# Patient Record
Sex: Male | Born: 1970 | Race: White | Hispanic: No | Smoking: Current every day smoker
Health system: Southern US, Community
[De-identification: ages and names within clinical notes are randomized; demographics above are authoritative.]

## PROBLEM LIST (undated history)

## (undated) DIAGNOSIS — M5030 Other cervical disc degeneration, unspecified cervical region: Secondary | ICD-10-CM

## (undated) DIAGNOSIS — F4310 Post-traumatic stress disorder, unspecified: Secondary | ICD-10-CM

## (undated) HISTORY — PX: HERNIA REPAIR: SHX51

---

## 2005-04-27 ENCOUNTER — Emergency Department: Payer: Self-pay | Admitting: Unknown Physician Specialty

## 2005-04-27 ENCOUNTER — Other Ambulatory Visit: Payer: Self-pay

## 2006-06-09 ENCOUNTER — Emergency Department: Payer: Self-pay | Admitting: Emergency Medicine

## 2010-08-29 ENCOUNTER — Emergency Department (HOSPITAL_COMMUNITY): Admission: EM | Admit: 2010-08-29 | Discharge: 2010-08-29 | Payer: Self-pay | Admitting: Emergency Medicine

## 2017-03-01 ENCOUNTER — Emergency Department: Payer: Self-pay

## 2017-03-01 ENCOUNTER — Encounter: Payer: Self-pay | Admitting: Emergency Medicine

## 2017-03-01 ENCOUNTER — Emergency Department
Admission: EM | Admit: 2017-03-01 | Discharge: 2017-03-01 | Disposition: A | Discharge status: Home / Self Care | Payer: Self-pay | Attending: Emergency Medicine | Admitting: Emergency Medicine

## 2017-03-01 DIAGNOSIS — D168 Benign neoplasm of pelvic bones, sacrum and coccyx: Secondary | ICD-10-CM | POA: Insufficient documentation

## 2017-03-01 DIAGNOSIS — M545 Low back pain, unspecified: Secondary | ICD-10-CM

## 2017-03-01 DIAGNOSIS — R52 Pain, unspecified: Secondary | ICD-10-CM

## 2017-03-01 DIAGNOSIS — F17210 Nicotine dependence, cigarettes, uncomplicated: Secondary | ICD-10-CM | POA: Insufficient documentation

## 2017-03-01 DIAGNOSIS — D492 Neoplasm of unspecified behavior of bone, soft tissue, and skin: Secondary | ICD-10-CM

## 2017-03-01 HISTORY — DX: Post-traumatic stress disorder, unspecified: F43.10

## 2017-03-01 MED ORDER — OXYCODONE-ACETAMINOPHEN 5-325 MG PO TABS: 1.0000 | ORAL_TABLET | Freq: Four times a day (QID) | ORAL | 0 refills | Status: DC | PRN

## 2017-03-01 MED ORDER — OXYCODONE-ACETAMINOPHEN 5-325 MG PO TABS
1.0000 | ORAL_TABLET | Freq: Once | ORAL | Status: AC
  Administered 2017-03-01: 1 via ORAL
  Filled 2017-03-01: qty 1

## 2017-03-01 MED ORDER — IBUPROFEN 600 MG PO TABS: 600.0000 mg | ORAL_TABLET | Freq: Three times a day (TID) | ORAL | 0 refills | Status: DC | PRN

## 2017-03-01 MED ORDER — IBUPROFEN 600 MG PO TABS
600.0000 mg | ORAL_TABLET | Freq: Once | ORAL | Status: AC
  Administered 2017-03-01: 600 mg via ORAL
  Filled 2017-03-01: qty 1

## 2017-03-01 NOTE — ED Triage Notes (Signed)
Pt ambulatory to triage with no difficulty. Pt reports he is having lower back pain. Pt reports hx same. Pt reports pain wore over the last few days after doing some heavy lifting.

## 2017-03-01 NOTE — Discharge Instructions (Signed)
Please take your Percocet as needed for pain in the next day or 2. Return to the emergency department for any concerns. Please make an appointment to follow-up with an orthopedic surgeon regarding your chronic mass in your pelvis. This needs to be further evaluated and treated.  It was a pleasure to take care of you today, and thank you for coming to our emergency department.  If you have any questions or concerns before leaving please ask the nurse to grab me and I'm more than happy to go through your aftercare instructions again.  If you were prescribed any opioid pain medication today such as Norco, Vicodin, Percocet, morphine, hydrocodone, or oxycodone please make sure you do not drive when you are taking this medication as it can alter your ability to drive safely.  If you have any concerns once you are home that you are not improving or are in fact getting worse before you can make it to your follow-up appointment, please do not hesitate to call 911 and come back for further evaluation.  Darel Hong MD  No results found for this or any previous visit. Dg Lumbar Spine Complete  Result Date: 03/01/2017 CLINICAL DATA:  46 y/o M; lower back pain worse over the last few days after heavy lifting. EXAM: LUMBAR SPINE - COMPLETE 4+ VIEW COMPARISON:  08/29/2010 lumbar radiographs FINDINGS: Transitional L5 vertebral body with partial sacralization with. Normal lumbar lordosis without listhesis. Lumbar spondylosis with moderate L4-5 disc space narrowing. Lower lumbar facet arthrosis. No loss of vertebral body height. Lumbar changes appear stable from prior lumbar radiographs. IMPRESSION: 1. No acute osseous abnormality identified. 2. Transitional lumbosacral anatomy. Stable lumbar degenerative changes. 3. Please refer to concurrent pelvic radiographs for evaluation of bony pelvis. Electronically Signed   By: Kristine Garbe M.D.   On: 03/01/2017 04:55   Dg Pelvis 1-2 Views  Result Date:  03/01/2017 CLINICAL DATA:  46 y/o M; lower back pain worse over the last few days after heavy lifting. EXAM: PELVIS - 1-2 VIEW COMPARISON:  08/29/2010 pelvis radiographs. FINDINGS: Mixed lytic and sclerotic lesion within the left ilium is grossly stable from prior pelvic radiographs. Bilateral hip joints, sacroiliac joints, and symphysis pubis are well maintained. No acute fracture identified. IMPRESSION: No acute fracture or dislocation. 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 This can be further assessed with pelvic MRI with and without contrast. Electronically Signed   By: Kristine Garbe M.D.   On: 03/01/2017 04:52

## 2017-03-01 NOTE — ED Provider Notes (Signed)
[facility] Emergency Department Provider Note  ____________________________________________   First MD Initiated Contact with Patient 03/01/17 0411     (approximate)  I have reviewed the triage vital signs and the nursing notes.   HISTORY  Chief Complaint Back Pain    HPI Patrick Hammond. is a 46 y.o. male who comes to the emergency department with an acute exacerbation of low back pain. He has a long-standing history of low back pain but is not had an episode in over a year. He has recently been working fixing rooms in the heat and yesterday felt a tweak in his back. He has moderate to severe aching left low back pain worse with movement and improved with rest. It does not radiate down his legs. He denies particular trauma. He denies fevers or chills. He denies numbness or weakness. He denies urinary or fecal incontinence or hesitance. He took a Ambulance person from a friend which helped his pain.   Past Medical History:  Diagnosis Date  . PTSD (post-traumatic stress disorder)     There are no active problems to display for this patient.   Past Surgical History:  Procedure Laterality Date  . HERNIA REPAIR      Prior to Admission medications   Medication Sig Start Date End Date Taking? Authorizing Provider  ibuprofen (ADVIL,MOTRIN) 600 MG tablet Take 1 tablet (600 mg total) by mouth every 8 (eight) hours as needed. 03/01/17   Darel Hong, MD  oxyCODONE-acetaminophen (ROXICET) 5-325 MG tablet Take 1 tablet by mouth every 6 (six) hours as needed. 03/01/17   Darel Hong, MD    Allergies Shellfish allergy  History reviewed. No pertinent family history.  Social History Social History  Substance Use Topics  . Smoking status: Current Every Day Smoker    Packs/day: 1.50    Years: 25.00  . Smokeless tobacco: Never Used  . Alcohol use No    Review of Systems Constitutional: No fever/chills Eyes: No visual changes. ENT: No sore  throat. Cardiovascular: Denies chest pain. Respiratory: Denies shortness of breath. Gastrointestinal: No abdominal pain.  No nausea, no vomiting.  No diarrhea.  No constipation. Genitourinary: Negative for dysuria. Musculoskeletal: Positive for back pain. Skin: Negative for rash. Neurological: Negative for headaches, focal weakness or numbness.  10-point ROS otherwise negative.  ____________________________________________   PHYSICAL EXAM:  VITAL SIGNS: ED Triage Vitals  Enc Vitals Group     BP 03/01/17 0018 132/77     Pulse Rate 03/01/17 0018 78     Resp 03/01/17 0018 18     Temp 03/01/17 0018 98.1 F (36.7 C)     Temp Source 03/01/17 0018 Oral     SpO2 03/01/17 0018 100 %     Weight 03/01/17 0018 190 lb (86.2 kg)     Height 03/01/17 0018 4\' 10"  (1.473 m)     Head Circumference --      Peak Flow --      Pain Score 03/01/17 0017 10     Pain Loc --      Pain Edu? --      Excl. in [place]? --     Constitutional: Alert and oriented x 4 Appears uncomfortable wincing in pain Eyes: PERRL EOMI. Head: Atraumatic. Nose: No congestion/rhinnorhea. Mouth/Throat: No trismus Neck: No stridor.   Cardiovascular: Normal rate, regular rhythm. Grossly normal heart sounds.  Good peripheral circulation. Respiratory: Normal respiratory effort.  No retractions. Lungs CTAB and moving good air Gastrointestinal: Soft nontender Musculoskeletal: No midline  back tenderness he is tender left low back paraspinal muscle spasm 5 out of 5 hip flexion and hip extension plantar flexion dorsiflexion 2+ DTRs no ankle clonus ambulates with strong steady but antalgic gait Neurologic:  Normal speech and language. No gross focal neurologic deficits are appreciated. Skin:  Skin is warm, dry and intact. No rash noted. Psychiatric: Mood and affect are normal. Speech and behavior are normal.     ____________________________________________   LABS (all labs ordered are listed, but only abnormal results are  displayed)  Labs Reviewed - No data to display   __________________________________________  EKG   ____________________________________________  RADIOLOGY  Lumbar spine x-ray with no acute disease pelvic x-ray with stable tumor ____________________________________________   PROCEDURES  Procedure(s) performed: no  Procedures  Critical Care performed: no  Observation: no ____________________________________________   INITIAL IMPRESSION / ASSESSMENT AND PLAN / ED COURSE  Pertinent labs & imaging results that were available during my care of the patient were reviewed by me and considered in my medical decision making (see chart for details).  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 The patient says he has had an MRI in the past but does not know the results and was told that he was supposed to have surgery on his back but he declined it at that time. Given this unclear history I'll obtain x-rays today.  Fortunately the patient's x-ray shows that the tumor is in stable condition. He has no red flags for cauda equina or other serious low back issues. His pain is improved with Percocet. I will give him a short course for his acute exacerbation and refer him to orthopedic surgery outpatient. He is discharged home in improved and good condition.      ____________________________________________   FINAL CLINICAL IMPRESSION(S) / ED DIAGNOSES  Final diagnoses:  Pain  Acute left-sided low back pain without sciatica  Bone tumor      NEW MEDICATIONS STARTED DURING THIS VISIT:  Discharge Medication List as of 03/01/2017  5:39 AM    START taking these medications   Details  ibuprofen (ADVIL,MOTRIN) 600 MG tablet Take 1 tablet (600 mg total) by mouth every 8 (eight) hours as needed., Starting Mon 03/01/2017, Print    oxyCODONE-acetaminophen (ROXICET) 5-325 MG tablet Take 1  tablet by mouth every 6 (six) hours as needed., Starting Mon 03/01/2017, Print         Note:  This document was prepared using Dragon voice recognition software and may include unintentional dictation errors.     Darel Hong, MD 03/01/17 [id number]

## 2017-05-30 ENCOUNTER — Emergency Department: Payer: Self-pay

## 2017-05-30 ENCOUNTER — Encounter: Payer: Self-pay | Admitting: Emergency Medicine

## 2017-05-30 ENCOUNTER — Other Ambulatory Visit: Payer: Self-pay

## 2017-05-30 ENCOUNTER — Emergency Department
Admission: EM | Admit: 2017-05-30 | Discharge: 2017-05-30 | Discharge status: Against Medical Advice | Payer: Self-pay | Attending: Emergency Medicine | Admitting: Emergency Medicine

## 2017-05-30 DIAGNOSIS — Z5320 Procedure and treatment not carried out because of patient's decision for unspecified reasons: Secondary | ICD-10-CM | POA: Insufficient documentation

## 2017-05-30 DIAGNOSIS — F17210 Nicotine dependence, cigarettes, uncomplicated: Secondary | ICD-10-CM | POA: Insufficient documentation

## 2017-05-30 DIAGNOSIS — R202 Paresthesia of skin: Secondary | ICD-10-CM | POA: Insufficient documentation

## 2017-05-30 HISTORY — DX: Other cervical disc degeneration, unspecified cervical region: M50.30

## 2017-05-30 MED ORDER — KETOROLAC TROMETHAMINE 30 MG/ML IJ SOLN: 30.0000 mg | Freq: Once | INTRAMUSCULAR | Status: DC

## 2017-05-30 NOTE — ED Triage Notes (Signed)
Pt reports 3 weeks of bilateral hand numbness, worse in the left; numbness radiates up the back of both arms; pt says at nights his hands will "draw up" and wake him from sleep; pt with history of cervical disc problems;

## 2017-05-30 NOTE — ED Notes (Signed)
Patient is c/o bilateral numb hand pain that includes his whole thumb, first digit, and half of his remaining digits.  Patient states he works a lot with his hands and has a lot of repetitive motion.  He states this has been a problem for three weeks.

## 2017-05-30 NOTE — ED Provider Notes (Signed)
[facility] Emergency Department Provider Note   ____________________________________________   First MD Initiated Contact with Patient 05/30/17 2308     (approximate)  I have reviewed the triage vital signs and the nursing notes.   HISTORY  Chief Complaint Numbness    HPI Patrick Hammond. is a 46 y.o. male who comes into the hospital today with some bilateral hand numbness.the patient states that the symptoms started about 3 weeks to a month ago. He states that it has been bothering him which is why he came into the hospital this evening. He states that his fingers are cold and he can't feel them. He works 7 days a week so he is unable to just stop and come by to be evaluated. The patient states that the numbness is worse left greater than right. He reports the numbness is from his hand all the way to the tips of his fingers. The patient also has some numbness going underneath his arms bilaterally. He states he feels weak in his hands as well. The patient denies any speech or gait difficulties. He reports that it does hurt and it wakes him up 4 to 5 times per night. The patient states that his fingers lock up and he cant move. The patient rates his pain and 8 out of 10 in intensity.He's had some occasional numbness in his feet as well. The patient has a of back problems but denies any neck problems. The patient has been taking percocet which he was given by some friends and family. He is here today for evaluation   Past Medical History:  Diagnosis Date  . Degenerative disc disease, cervical   . PTSD (post-traumatic stress disorder)     There are no active problems to display for this patient.   Past Surgical History:  Procedure Laterality Date  . HERNIA REPAIR      Prior to Admission medications   Medication Sig Start Date End Date Taking? Authorizing Provider  ibuprofen (ADVIL,MOTRIN) 600 MG tablet Take 1 tablet (600 mg total) by mouth every 8 (eight)  hours as needed. 03/01/17  Yes Darel Hong, MD    Allergies Shellfish allergy  History reviewed. No pertinent family history.  Social History Social History  Substance Use Topics  . Smoking status: Current Every Day Smoker    Packs/day: 2.00    Years: 25.00    Types: Cigarettes  . Smokeless tobacco: Never Used  . Alcohol use No    Review of Systems  Constitutional: No fever/chills Eyes: No visual changes. ENT: No sore throat. Cardiovascular: Denies chest pain. Respiratory: Denies shortness of breath. Gastrointestinal: No abdominal pain.  No nausea, no vomiting.  No diarrhea.  No constipation. Genitourinary: Negative for dysuria. Musculoskeletal: Negative for back pain. Skin: Negative for rash. Neurological: numbness and weakness to bilateral heands   ____________________________________________   PHYSICAL EXAM:  VITAL SIGNS: ED Triage Vitals  Enc Vitals Group     BP 05/30/17 2105 (!) 161/84     Pulse Rate 05/30/17 2105 84     Resp 05/30/17 2105 18     Temp 05/30/17 2105 98.3 F (36.8 C)     Temp Source 05/30/17 2105 Oral     SpO2 05/30/17 2105 97 %     Weight 05/30/17 2106 190 lb (86.2 kg)     Height 05/30/17 2106 5\' 10"  (1.778 m)     Head Circumference --      Peak Flow --  Pain Score 05/30/17 2105 6     Pain Loc --      Pain Edu? --      Excl. in [place]? --     Constitutional: Alert and oriented. Well appearing and in mild distress. Eyes: Conjunctivae are normal. PERRL. EOMI. Head: Atraumatic. Nose: No congestion/rhinnorhea. Mouth/Throat: Mucous membranes are moist.  Oropharynx non-erythematous. Neck: No cervical spine tenderness to palpation. Cardiovascular: Normal rate, regular rhythm. Grossly normal heart sounds.  Good peripheral circulation. Respiratory: Normal respiratory effort.  No retractions. Lungs CTAB. Gastrointestinal: Soft and nontender. No distention. Positive bowel sounds Musculoskeletal: No lower extremity tenderness nor edema.     Neurologic:  Normal speech and language. Cranial nerves II - XII are grossly intact, strength 5/5 in upper and lower extremities, patient endorses some numbness to his fingers bilaterally Skin:  Skin is warm, dry and intact.  Psychiatric: Mood and affect are normal.  ____________________________________________   LABS (all labs ordered are listed, but only abnormal results are displayed)  Labs Reviewed - No data to display ____________________________________________  EKG  none ____________________________________________  RADIOLOGY  No results found.  ____________________________________________   PROCEDURES  Procedure(s) performed: None  Procedures  Critical Care performed: No  ____________________________________________   INITIAL IMPRESSION / ASSESSMENT AND PLAN / ED COURSE  Pertinent labs & imaging results that were available during my care of the patient were reviewed by me and considered in my medical decision making (see chart for details).  This is a 46 year old male who comes into the hospital today with some numbness to his bilateral fingers. I did evaluate the patient and then ordered a CT scan of his head and cervical spine. I also ordered a CBC and BMP to evaluate for possible neuropathy causing the symptoms. I also ordered some Toradol to treat the patient's discomfort. After I left the room the patient decided he did not want to stay for his evaluation. The patient stated that he has to work tomorrow and he does not want to be here half the night. I informed the patient that I am unable to help determine what may be the cause of his symptoms and he reports that he is okay with that. He will sign out [place]. The patient should follow-up with his primary care physician or with the walk in clinic.      ____________________________________________   FINAL CLINICAL IMPRESSION(S) / ED DIAGNOSES  Final diagnoses:  Paresthesia      NEW  MEDICATIONS STARTED DURING THIS VISIT:  Discharge Medication List as of 05/30/2017 11:37 PM       Note:  This document was prepared using Dragon voice recognition software and may include unintentional dictation errors.    Loney Hering, MD 05/30/17 [phone]

## 2017-05-30 NOTE — ED Notes (Signed)
Patient stating he can't stay any longer and wishes to leave.  MD notified, AMA form signed.

## 2017-06-28 ENCOUNTER — Encounter: Payer: Self-pay | Admitting: Emergency Medicine

## 2017-06-28 ENCOUNTER — Emergency Department
Admission: EM | Admit: 2017-06-28 | Discharge: 2017-06-28 | Disposition: A | Discharge status: Home / Self Care | Payer: Self-pay | Attending: Emergency Medicine | Admitting: Emergency Medicine

## 2017-06-28 DIAGNOSIS — Z79899 Other long term (current) drug therapy: Secondary | ICD-10-CM | POA: Insufficient documentation

## 2017-06-28 DIAGNOSIS — F1110 Opioid abuse, uncomplicated: Secondary | ICD-10-CM | POA: Insufficient documentation

## 2017-06-28 DIAGNOSIS — F17210 Nicotine dependence, cigarettes, uncomplicated: Secondary | ICD-10-CM | POA: Insufficient documentation

## 2017-06-28 LAB — ACETAMINOPHEN LEVEL: Acetaminophen (Tylenol), Serum: <10 ug/mL — ABNORMAL LOW (ref 10–30)

## 2017-06-28 LAB — COMPREHENSIVE METABOLIC PANEL
ALT: 19 U/L (ref 17–63)
AST: 28 U/L (ref 15–41)
Albumin: 3.9 g/dL (ref 3.5–5.0)
Alkaline Phosphatase: 90 U/L (ref 38–126)
Anion gap: 6 (ref 5–15)
BILIRUBIN TOTAL: 0.4 mg/dL (ref 0.3–1.2)
BUN: 13 mg/dL (ref 6–20)
CO2: 25 mmol/L (ref 22–32)
Calcium: 9 mg/dL (ref 8.9–10.3)
Chloride: 105 mmol/L (ref 101–111)
Creatinine, Ser: 0.9 mg/dL (ref 0.61–1.24)
GFR calc Af Amer: >60 mL/min (ref >60)
Glucose, Bld: 121 mg/dL — ABNORMAL HIGH (ref 65–99)
POTASSIUM: 4 mmol/L (ref 3.5–5.1)
Sodium: 136 mmol/L (ref 135–145)
TOTAL PROTEIN: 8 g/dL (ref 6.5–8.1)

## 2017-06-28 LAB — URINE DRUG SCREEN, QUALITATIVE (ARMC ONLY)
AMPHETAMINES, UR SCREEN: POSITIVE — AB
BARBITURATES, UR SCREEN: NOT DETECTED
BENZODIAZEPINE, UR SCRN: NOT DETECTED
CANNABINOID 50 NG, UR ~~LOC~~: NOT DETECTED
Cocaine Metabolite,Ur ~~LOC~~: NOT DETECTED
MDMA (Ecstasy)Ur Screen: NOT DETECTED
Methadone Scn, Ur: NOT DETECTED
Opiate, Ur Screen: POSITIVE — AB
PHENCYCLIDINE (PCP) UR S: NOT DETECTED
Tricyclic, Ur Screen: NOT DETECTED

## 2017-06-28 LAB — CBC
HCT: 42.2 % (ref 40.0–52.0)
Hemoglobin: 15.1 g/dL (ref 13.0–18.0)
MCH: 31.2 pg (ref 26.0–34.0)
MCHC: 35.7 g/dL (ref 32.0–36.0)
MCV: 87.4 fL (ref 80.0–100.0)
PLATELETS: 272 10*3/uL (ref 150–440)
RBC: 4.83 MIL/uL (ref 4.40–5.90)
RDW: 12.8 % (ref 11.5–14.5)
WBC: 11.1 10*3/uL — AB (ref 3.8–10.6)

## 2017-06-28 LAB — ETHANOL

## 2017-06-28 LAB — SALICYLATE LEVEL: Salicylate Lvl: <7 mg/dL (ref 2.8–30.0)

## 2017-06-28 MED ORDER — SODIUM CHLORIDE 0.9 % IV BOLUS (SEPSIS)
1000.0000 mL | Freq: Once | INTRAVENOUS | Status: AC
  Administered 2017-06-28: 1000 mL via INTRAVENOUS

## 2017-06-28 NOTE — ED Provider Notes (Signed)
[facility] Emergency Department Provider Note   ____________________________________________   I have reviewed the triage vital signs and the nursing notes.   HISTORY  Chief Complaint Opioid use  History limited by: Not Limited   HPI Patrick Hammond. is a 46 y.o. male who presents to the emergency department today accompanied by sheriff department because of concerns for opioid use. The patient was being taken into custody when he states he took five 15 milligrams oxycodone immediate release tablets. This happened about 3 hours prior to my evaluation. The patient denies any shortness of breath. Denies any chest pain. Denies having any problems with taking too many opioids in the past.    Past Medical History:  Diagnosis Date  . Degenerative disc disease, cervical   . PTSD (post-traumatic stress disorder)     There are no active problems to display for this patient.   Past Surgical History:  Procedure Laterality Date  . HERNIA REPAIR      Prior to Admission medications   Medication Sig Start Date End Date Taking? Authorizing Provider  ibuprofen (ADVIL,MOTRIN) 600 MG tablet Take 1 tablet (600 mg total) by mouth every 8 (eight) hours as needed. 03/01/17   Darel Hong, MD    Allergies Shellfish allergy  History reviewed. No pertinent family history.  Social History Social History  Substance Use Topics  . Smoking status: Current Every Day Smoker    Packs/day: 2.00    Years: 25.00    Types: Cigarettes  . Smokeless tobacco: Never Used  . Alcohol use No    Review of Systems Constitutional: No fever/chills Eyes: No visual changes. ENT: No sore throat. Cardiovascular: Denies chest pain. Respiratory: Denies shortness of breath. Gastrointestinal: No abdominal pain.  No nausea, no vomiting.  No diarrhea.   Genitourinary: Negative for dysuria. Musculoskeletal: Negative for back pain. Skin: Negative for rash. Neurological: Negative for  headaches, focal weakness or numbness.  ____________________________________________   PHYSICAL EXAM:  VITAL SIGNS: ED Triage Vitals  Enc Vitals Group     BP 06/28/17 2010 (!) 132/100     Pulse Rate 06/28/17 2010 96     Resp 06/28/17 2010 17     Temp 06/28/17 2010 98.3 F (36.8 C)     Temp Source 06/28/17 2010 Oral     SpO2 06/28/17 2010 98 %     Weight 06/28/17 2007 190 lb (86.2 kg)   Constitutional: Alert and oriented. Well appearing and in no distress. Eyes: Conjunctivae are normal.  ENT   Head: Normocephalic and atraumatic.   Nose: No congestion/rhinnorhea.   Mouth/Throat: Mucous membranes are moist.   Neck: No stridor. Hematological/Lymphatic/Immunilogical: No cervical lymphadenopathy. Cardiovascular: Normal rate, regular rhythm.  No murmurs, rubs, or gallops.  Respiratory: Normal respiratory effort without tachypnea nor retractions. Breath sounds are clear and equal bilaterally. No wheezes/rales/rhonchi. Gastrointestinal: Soft and non tender. No rebound. No guarding.  Genitourinary: Deferred Musculoskeletal: Normal range of motion in all extremities. No lower extremity edema. Neurologic:  Normal speech and language. No gross focal neurologic deficits are appreciated.  Skin:  Skin is warm, dry and intact. No rash noted. Psychiatric: Mood and affect are normal. Speech and behavior are normal. Patient exhibits appropriate insight and judgment.  ____________________________________________    LABS (pertinent positives/negatives)  Labs Reviewed  COMPREHENSIVE METABOLIC PANEL - Abnormal; Notable for the following:       Result Value   Glucose, Bld 121 (*)    All other components within normal limits  ACETAMINOPHEN LEVEL -  Abnormal; Notable for the following:    Acetaminophen (Tylenol), Serum <10 (*)    All other components within normal limits  CBC - Abnormal; Notable for the following:    WBC 11.1 (*)    All other components within normal limits  URINE  DRUG SCREEN, QUALITATIVE ([facility] ONLY) - Abnormal; Notable for the following:    Amphetamines, Ur Screen POSITIVE (*)    Opiate, Ur Screen POSITIVE (*)    All other components within normal limits  ETHANOL  SALICYLATE LEVEL     ____________________________________________   EKG  None  ____________________________________________    RADIOLOGY  None  ____________________________________________   PROCEDURES  Procedures  ____________________________________________   INITIAL IMPRESSION / ASSESSMENT AND PLAN / ED COURSE  Pertinent labs & imaging results that were available during my care of the patient were reviewed by me and considered in my medical decision making (see chart for details).  Patient presents to the emergency department accompanied by sugars department because of concerns for taking 5:15 milligrams immediate release oxycodone's. This happened about 3 hours prior to my evaluation. Patient is not showing any signs of respiratory depression or sedation. Will plan on discharging.  ____________________________________________   FINAL CLINICAL IMPRESSION(S) / ED DIAGNOSES  Final diagnoses:  Opioid abuse, episodic use     Note: This dictation was prepared with Dragon dictation. Any transcriptional errors that result from this process are unintentional     Nance Pear, MD 06/28/17 2348

## 2017-06-28 NOTE — ED Notes (Signed)
Pt discharged to jail via [facility].  Pt in NAD, A&Ox4 upon discharge.  Pt medically released by Dr. Archie Balboa.

## 2017-06-28 NOTE — ED Triage Notes (Signed)
Pt arrived to triage in custody with [employer] deputies x3. Per officers, pt was in route jail when I swallowed 5, 15 mg Oxycodone, approximately 30 minutes ago. Pt able to talk in complete sentences, gait steady. Pt denies SI and HI at this time.

## 2017-06-28 NOTE — Discharge Instructions (Signed)
Patient is medically cleared for jail. Please seek medical attention for any high fevers, chest pain, shortness of breath, change in behavior, persistent vomiting, bloody stool or any other new or concerning symptoms.

## 2017-09-07 ENCOUNTER — Other Ambulatory Visit: Payer: Self-pay

## 2017-09-07 ENCOUNTER — Emergency Department
Admission: EM | Admit: 2017-09-07 | Discharge: 2017-09-07 | Disposition: A | Discharge status: Home / Self Care | Payer: Self-pay | Attending: Emergency Medicine | Admitting: Emergency Medicine

## 2017-09-07 DIAGNOSIS — M791 Myalgia, unspecified site: Secondary | ICD-10-CM | POA: Insufficient documentation

## 2017-09-07 DIAGNOSIS — F17210 Nicotine dependence, cigarettes, uncomplicated: Secondary | ICD-10-CM | POA: Insufficient documentation

## 2017-09-07 DIAGNOSIS — Z79899 Other long term (current) drug therapy: Secondary | ICD-10-CM | POA: Insufficient documentation

## 2017-09-07 DIAGNOSIS — J020 Streptococcal pharyngitis: Secondary | ICD-10-CM

## 2017-09-07 LAB — POCT RAPID STREP A: STREPTOCOCCUS, GROUP A SCREEN (DIRECT): POSITIVE — AB

## 2017-09-07 MED ORDER — AMOXICILLIN 500 MG PO CAPS
500.0000 mg | ORAL_CAPSULE | Freq: Once | ORAL | Status: AC
  Administered 2017-09-07: 500 mg via ORAL
  Filled 2017-09-07: qty 1

## 2017-09-07 MED ORDER — ACETAMINOPHEN-CODEINE #3 300-30 MG PO TABS: 1.0000 | ORAL_TABLET | Freq: Three times a day (TID) | ORAL | 0 refills | Status: DC | PRN

## 2017-09-07 MED ORDER — AMOXICILLIN 500 MG PO CAPS
500.0000 mg | ORAL_CAPSULE | Freq: Three times a day (TID) | ORAL | 0 refills | Status: DC
Start: 2017-09-07 — End: 2020-12-04

## 2017-09-07 MED ORDER — LIDOCAINE VISCOUS 2 % MT SOLN
15.0000 mL | Freq: Once | OROMUCOSAL | Status: AC
  Administered 2017-09-07: 15 mL via OROMUCOSAL
  Filled 2017-09-07: qty 15

## 2017-09-07 NOTE — ED Triage Notes (Signed)
Pt c/o flu like symptoms such as body aches for the past 3 days. Pt states he is going between chills and burning up. Pt also reports sore throat and headache. Pt reports a temperature of 101 at home.

## 2017-09-07 NOTE — ED Provider Notes (Signed)
[facility] Emergency Department Provider Note ____________________________________________  Time seen: 1830  I have reviewed the triage vital signs and the nursing notes.  HISTORY  Chief Complaint  Fever and Generalized Body Aches  HPI Patrick Hammond. is a 46 y.o. male presents to the ED with complaints of flulike symptoms.  Patient describes the last 3 days he says body aches, fevers and chills.  He also describes a severe sore throat that is kept him from eating and drinking as much as normal.  He has been taking Advil, Tylenol, and DayQuil at home with limited benefit.  He denies any sick contacts, recent travel, or other exposures.  He did receive the seasonal flu vaccine a few weeks prior.  Past Medical History:  Diagnosis Date  . Degenerative disc disease, cervical   . PTSD (post-traumatic stress disorder)     There are no active problems to display for this patient.   Past Surgical History:  Procedure Laterality Date  . HERNIA REPAIR      Prior to Admission medications   Medication Sig Start Date End Date Taking? Authorizing Provider  acetaminophen-codeine (TYLENOL #3) 300-30 MG tablet Take 1 tablet by mouth every 8 (eight) hours as needed for moderate pain. 09/07/17   Braeden Dolinski, Dannielle Karvonen, PA-C  amoxicillin (AMOXIL) 500 MG capsule Take 1 capsule (500 mg total) by mouth 3 (three) times daily. 09/07/17   Ivylynn Hoppes, Dannielle Karvonen, PA-C  ibuprofen (ADVIL,MOTRIN) 600 MG tablet Take 1 tablet (600 mg total) by mouth every 8 (eight) hours as needed. 03/01/17   Darel Hong, MD    Allergies Shellfish allergy  History reviewed. No pertinent family history.  Social History Social History   Tobacco Use  . Smoking status: Current Every Day Smoker    Packs/day: 2.00    Years: 25.00    Pack years: 50.00    Types: Cigarettes  . Smokeless tobacco: Never Used  Substance Use Topics  . Alcohol use: No  . Drug use: No    Review of  Systems  Constitutional: Positive for fever. Eyes: Negative for visual changes. ENT: Positive for sore throat. Cardiovascular: Negative for chest pain. Respiratory: Negative for shortness of breath. Gastrointestinal: Negative for abdominal pain, vomiting and diarrhea. Genitourinary: Negative for dysuria. Musculoskeletal: Negative for back pain.  Reports generalized body aches.   Skin: Negative for rash. Neurological: Negative for headaches, focal weakness or numbness. ____________________________________________  PHYSICAL EXAM:  VITAL SIGNS: ED Triage Vitals  Enc Vitals Group     BP 09/07/17 1741 140/77     Pulse Rate 09/07/17 1741 99     Resp 09/07/17 1741 16     Temp 09/07/17 1741 99 F (37.2 C)     Temp Source 09/07/17 1741 Oral     SpO2 09/07/17 1741 96 %     Weight 09/07/17 1741 190 lb (86.2 kg)     Height 09/07/17 1741 5\' 10"  (1.778 m)     Head Circumference --      Peak Flow --      Pain Score 09/07/17 1740 8     Pain Loc --      Pain Edu? --      Excl. in [place]? --     Constitutional: Alert and oriented. Well appearing and in no distress. Head: Normocephalic and atraumatic. Eyes: Conjunctivae are normal. PERRL. Normal extraocular movements Ears: Canals clear. TMs intact bilaterally. Nose: No congestion/rhinorrhea/epistaxis. Mouth/Throat: Mucous membranes are moist.  Uvula is midline but tonsils  are erythematous, edematous, and have moderate purulent exudate. Neck: Supple. No thyromegaly. Hematological/Lymphatic/Immunological: Palpable right greater than left cervical lymphadenopathy. Cardiovascular: Normal rate, regular rhythm. Normal distal pulses. Respiratory: Normal respiratory effort. No wheezes/rales/rhonchi. Gastrointestinal: Soft and nontender. No distention. Neurologic:  Normal gait without ataxia. Normal speech and language. No gross focal neurologic deficits are appreciated. Skin:  Skin is warm, dry and intact. No rash  noted. ____________________________________________   LABS (pertinent positives/negatives)  Labs Reviewed  POCT RAPID STREP A - Abnormal; Notable for the following components:      Result Value   Streptococcus, Group A Screen (Direct) POSITIVE (*)    All other components within normal limits  ____________________________________________  PROCEDURES  Procedures Amoxicillin 500 mg PO Viscous lido 2% gargle ____________________________________________  INITIAL IMPRESSION / ASSESSMENT AND PLAN / ED COURSE  Patient with a ED evaluation of sudden onset of sore throat, fevers, and body aches.  Patient was found to have a positive rapid strep screening test.  He is treated with amoxicillin and Tylenol #3, he is given instructions for follow-up for worsening symptoms.  He will also be provided with a work note for 2 days as requested. ____________________________________________  FINAL CLINICAL IMPRESSION(S) / ED DIAGNOSES  Final diagnoses:  Strep pharyngitis      Carmie End, Dannielle Karvonen, PA-C 09/07/17 1903    Harvest Dark, MD 09/07/17 2320

## 2017-09-07 NOTE — Discharge Instructions (Signed)
You have been diagnosed with strep throat. Take the antibiotic as directed. Rinse with warm-salty water and drink plenty of fluids. Change your toothbrush after 24-hours on the antibiotic. Follow-up with [facility] or return for worsening symptoms. Continue your home meds.

## 2017-09-30 ENCOUNTER — Encounter: Payer: Self-pay | Admitting: Emergency Medicine

## 2017-09-30 ENCOUNTER — Other Ambulatory Visit: Payer: Self-pay

## 2017-09-30 DIAGNOSIS — Z5321 Procedure and treatment not carried out due to patient leaving prior to being seen by health care provider: Secondary | ICD-10-CM | POA: Insufficient documentation

## 2017-09-30 DIAGNOSIS — M545 Low back pain: Secondary | ICD-10-CM | POA: Insufficient documentation

## 2017-09-30 NOTE — ED Triage Notes (Signed)
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

## 2017-10-01 ENCOUNTER — Emergency Department
Admission: EM | Admit: 2017-10-01 | Discharge: 2017-10-01 | Disposition: A | Discharge status: Home / Self Care | Payer: Self-pay | Attending: Emergency Medicine | Admitting: Emergency Medicine

## 2020-12-04 ENCOUNTER — Other Ambulatory Visit: Payer: Self-pay

## 2020-12-04 ENCOUNTER — Emergency Department
Admission: EM | Admit: 2020-12-04 | Discharge: 2020-12-04 | Disposition: A | Discharge status: Home / Self Care | Payer: Self-pay | Attending: Emergency Medicine | Admitting: Emergency Medicine

## 2020-12-04 DIAGNOSIS — K047 Periapical abscess without sinus: Secondary | ICD-10-CM | POA: Insufficient documentation

## 2020-12-04 DIAGNOSIS — F17210 Nicotine dependence, cigarettes, uncomplicated: Secondary | ICD-10-CM | POA: Insufficient documentation

## 2020-12-04 MED ORDER — AMOXICILLIN 875 MG PO TABS: 875.0000 mg | ORAL_TABLET | Freq: Two times a day (BID) | ORAL | 0 refills | Status: DC

## 2020-12-04 NOTE — Discharge Instructions (Addendum)
Been taking the amoxicillin twice a day for the next 10 days beginning today and continue until completely finished.  You may take ibuprofen or Tylenol as needed for pain.  A list of dental clinics is on your discharge papers.  It may be that she can be seen sooner at 1 of these.  Keep in mind that the clinic at [facility] also has walk-in hours.  OPTIONS FOR DENTAL FOLLOW UP CARE  [place] [facility] and [place] [email or web address] [phone])  [facility] [phone])  [place] [phone])  [place] [phone])  [facility] [phone]) This clinic caters to the indigent population and is on a lottery system. Location: [employer] [facility], [place], [place], [place] Clinic Hours: Wednesdays from 6pm - 9pm, patients seen by a lottery system. For dates, call or go to [email or web address] Services: Cleanings, fillings and simple extractions. Payment Options: DENTAL WORK IS FREE OF CHARGE. Bring proof of income or support. Best way to get seen: Arrive at 5:15 pm - this is a lottery, NOT first come/first serve, so arriving earlier will not increase your chances of being seen.     [facility] [phone] Select option 1 for emergencies   Location: [facility], [place], [address], [place] Clinic Hours: No walk-ins accepted - call the day before to schedule an appointment. Check in times are 9:30 am and 1:30 pm. Services: Simple extractions, temporary fillings, pulpectomy/pulp debridement, uncomplicated abscess drainage. Payment Options: PAYMENT IS DUE AT THE TIME OF SERVICE.  Fee is usually $100-200, additional surgical procedures (e.g. abscess drainage) may be extra. Cash, checks,  Visa/MasterCard accepted.  Can file Medicaid if patient is covered for dental - patient should call case worker to check. No discount for [facility] patients. Best way to get seen: MUST call the day before and get onto the schedule. Can usually be seen the next 1-2 days. No walk-ins accepted.     [place] [phone]   Location: [place], [place] Clinic Hours: M, W, Th, F 8am or 1:30pm, Tues 9a or 1:30 - first come/first served. Services: Simple extractions, temporary fillings, uncomplicated abscess drainage.  You do not need to be an [facility] resident. Payment Options: PAYMENT IS DUE AT THE TIME OF SERVICE. Dental insurance, otherwise sliding scale - bring proof of income or support. Depending on income and treatment needed, cost is usually $50-200. Best way to get seen: Arrive early as it is first come/first served.     [facility] [phone]   Location: [place] [facility] Hours: Mon-Thu 8a-5p Services: Most basic dental services including extractions and fillings. Payment Options: PAYMENT IS DUE AT THE TIME OF SERVICE. Sliding scale, up to 50% off - bring proof if income or support. Medicaid with dental option accepted. Best way to get seen: Call to schedule an appointment, can usually be seen within 2 weeks OR they will try to see walk-ins - show up at [place] or 2p (you may have to wait).     [facility] [place] RESIDENTS ONLY   Location: [facility], [place] [address] [place], [place] [address] Clinic Hours: By appointment only. Monday - Thursday 8am-5pm, Friday 8am-12pm Services: Cleanings, fillings, extractions. Payment Options: PAYMENT IS DUE AT THE TIME  OF SERVICE. Cash, Visa or MasterCard. Sliding scale - $30 minimum per service. Best way to get seen: Come in to office, complete packet and  make an appointment - need proof of income or support monies for each household member and proof of [facility] residence. Usually takes about a month to get in.     [facility] [phone]   Location: [address] [place] Clinic Hours: Walk-in Urgent Care Dental Services are offered Monday-Friday mornings only. The numbers of emergencies accepted daily is limited to the number of providers available. Maximum 15 - Mondays, Wednesdays & Thursdays Maximum 10 - Tuesdays & Fridays Services: You do not need to be a [facility] resident to be seen for a dental emergency. Emergencies are defined as pain, swelling, abnormal bleeding, or dental trauma. Walkins will receive x-rays if needed. NOTE: Dental cleaning is not an emergency. Payment Options: PAYMENT IS DUE AT THE TIME OF SERVICE. Minimum co-pay is $40.00 for uninsured patients. Minimum co-pay is $3.00 for Medicaid with dental coverage. Dental Insurance is accepted and must be presented at time of visit. Medicare does not cover dental. Forms of payment: Cash, credit card, checks. Best way to get seen: If not previously registered with the clinic, walk-in dental registration begins at 7:15 am and is on a first come/first serve basis. If previously registered with the clinic, call to make an appointment.     [facility] [place] ONLY   Location: [address] [place], [place] Clinic Hours: Mon-Thu 10a-2p Services: Extractions only! Payment Options: FREE (donations accepted) - bring proof of income or support Best way to get seen: Call and schedule an appointment OR come at 8am on the 1st Monday of every month (except for holidays) when it is first come/first served.     [facility] [phone]   Location: [place], [place] Clinic Hours: Friday mornings Services, Payment Options, Best way to get seen: Call for info

## 2020-12-04 NOTE — ED Notes (Signed)
See triage note  Presents with possible dental abscess    Noticed some swelling yesterday   Low grade temp on arrival

## 2020-12-04 NOTE — ED Triage Notes (Signed)
Pt comes with c/o right sided abscess. Pt states he noticed it yesterday and it has gotten worse today. Pt states some swelling.  Pt states he does have a bad tooth.

## 2020-12-04 NOTE — ED Provider Notes (Signed)
[facility] Emergency Department Provider Note   ____________________________________________   Event Date/Time   First MD Initiated Contact with Patient 12/04/20 0813     (approximate)  I have reviewed the triage vital signs and the nursing notes.   HISTORY  Chief Complaint Abscess   HPI Patrick Hammond. is a 50 y.o. male presents to the ED with complaint of right lower dental pain and swelling.  Patient states that he has had "bad teeth" for some time but yesterday noticed that there was swelling and this morning is worse.  He denies any fever or chills.  He reports that his wife called to get him a dental appointment and was told that he cannot be seen for 2 months.  Patient continues to smoke 2 packs of cigarettes per day.  Currently rates pain as 10/10.       Past Medical History:  Diagnosis Date  . Degenerative disc disease, cervical   . PTSD (post-traumatic stress disorder)     There are no problems to display for this patient.   Past Surgical History:  Procedure Laterality Date  . HERNIA REPAIR      Prior to Admission medications   Medication Sig Start Date End Date Taking? Authorizing Provider  amoxicillin (AMOXIL) 875 MG tablet Take 1 tablet (875 mg total) by mouth 2 (two) times daily. 12/04/20  Yes Johnn Hai, PA-C    Allergies Shellfish allergy  No family history on file.  Social History Social History   Tobacco Use  . Smoking status: Current Every Day Smoker    Packs/day: 2.00    Years: 25.00    Pack years: 50.00    Types: Cigarettes  . Smokeless tobacco: Never Used  Substance Use Topics  . Alcohol use: No  . Drug use: No    Review of Systems Constitutional: No fever/chills Eyes: No visual changes. ENT: No sore throat. Cardiovascular: Denies chest pain. Respiratory: Denies shortness of breath. Musculoskeletal: Negative for back pain. Skin: Negative for rash. Neurological: Negative for headaches, focal  weakness or numbness. ____________________________________________   PHYSICAL EXAM:  VITAL SIGNS: ED Triage Vitals  Enc Vitals Group     BP 12/04/20 0759 (!) 156/87     Pulse Rate 12/04/20 0759 (!) 107     Resp 12/04/20 0759 18     Temp 12/04/20 0759 99.2 F (37.3 C)     Temp Source 12/04/20 0759 Oral     SpO2 12/04/20 0759 99 %     Weight 12/04/20 0801 200 lb (90.7 kg)     Height 12/04/20 0801 5\' 10"  (1.778 m)     Head Circumference --      Peak Flow --      Pain Score 12/04/20 0759 10     Pain Loc --      Pain Edu? --      Excl. in [place]? --     Constitutional: Alert and oriented. Well appearing and in no acute distress. Eyes: Conjunctivae are normal.  Head: Atraumatic. Nose: No congestion/rhinnorhea. Mouth/Throat: Mucous membranes are moist.  Oropharynx non-erythematous.  Right lower mandible patient has numerous dental caries with most not above the gumline.  Gum is markedly tender and swollen.  No active drainage is noted. Neck: No stridor.   Cardiovascular: Normal rate, regular rhythm. Grossly normal heart sounds.  Good peripheral circulation. Respiratory: Normal respiratory effort.  No retractions. Lungs CTAB. Musculoskeletal: Moves upper and lower extremities they have difficulty normal gait was noted.  Neurologic:  Normal speech and language. No gross focal neurologic deficits are appreciated. No gait instability. Skin:  Skin is warm, dry and intact. Psychiatric: Mood and affect are normal. Speech and behavior are normal.  ____________________________________________   LABS (all labs ordered are listed, but only abnormal results are displayed)  Labs Reviewed - No data to display  PROCEDURES  Procedure(s) performed (including Critical Care):  Procedures   ____________________________________________   INITIAL IMPRESSION / ASSESSMENT AND PLAN / ED COURSE  As part of my medical decision making, I reviewed the following data within the electronic medical  record:  Notes from prior ED visits and [place] Controlled Substance Database   50 year old male presents to the ED with complaint of dental abscess.  Patient states that the swelling began yesterday.  He reports that his wife called for a dental appointment and was told that would be 2 months before he can be seen.  Patient was given a prescription for amoxicillin 875 twice daily for 10 days.  He was also given a list of dental clinics in the area and also told that the clinic at [facility] takes while can patients.  Patient will continue with Tylenol or ibuprofen as needed for pain.  ____________________________________________   FINAL CLINICAL IMPRESSION(S) / ED DIAGNOSES  Final diagnoses:  Dental abscess     ED Discharge Orders         Ordered    amoxicillin (AMOXIL) 875 MG tablet  2 times daily        12/04/20 0842          *Please note:  [name] was evaluated in Emergency Department on 12/04/2020 for the symptoms described in the history of present illness. He was evaluated in the context of the global COVID-19 pandemic, which necessitated consideration that the patient might be at risk for infection with the SARS-CoV-2 virus that causes COVID-19. Institutional protocols and algorithms that pertain to the evaluation of patients at risk for COVID-19 are in a state of rapid change based on information released by regulatory bodies including the CDC and federal and state organizations. These policies and algorithms were followed during the patient's care in the ED.  Some ED evaluations and interventions may be delayed as a result of limited staffing during and the pandemic.*   Note:  This document was prepared using Dragon voice recognition software and may include unintentional dictation errors.    Johnn Hai, PA-C 12/04/20 1039    Arta Silence, MD 12/04/20 [phone]

## 2021-06-22 ENCOUNTER — Other Ambulatory Visit: Payer: Self-pay

## 2021-06-22 ENCOUNTER — Emergency Department
Admission: EM | Admit: 2021-06-22 | Discharge: 2021-06-22 | Disposition: A | Discharge status: Home / Self Care | Payer: Medicaid Other | Attending: Emergency Medicine | Admitting: Emergency Medicine

## 2021-06-22 DIAGNOSIS — R509 Fever, unspecified: Secondary | ICD-10-CM | POA: Insufficient documentation

## 2021-06-22 DIAGNOSIS — Z20822 Contact with and (suspected) exposure to covid-19: Secondary | ICD-10-CM | POA: Insufficient documentation

## 2021-06-22 DIAGNOSIS — F17210 Nicotine dependence, cigarettes, uncomplicated: Secondary | ICD-10-CM | POA: Insufficient documentation

## 2021-06-22 DIAGNOSIS — H9203 Otalgia, bilateral: Secondary | ICD-10-CM | POA: Insufficient documentation

## 2021-06-22 DIAGNOSIS — R350 Frequency of micturition: Secondary | ICD-10-CM | POA: Insufficient documentation

## 2021-06-22 LAB — RESP PANEL BY RT-PCR (FLU A&B, COVID) ARPGX2
Influenza A by PCR: NEGATIVE
Influenza B by PCR: NEGATIVE
SARS Coronavirus 2 by RT PCR: NEGATIVE

## 2021-06-22 LAB — COMPREHENSIVE METABOLIC PANEL
ALT: 21 U/L (ref 0–44)
AST: 26 U/L (ref 15–41)
Albumin: 3.9 g/dL (ref 3.5–5.0)
Alkaline Phosphatase: 70 U/L (ref 38–126)
Anion gap: 6 (ref 5–15)
BUN: 17 mg/dL (ref 6–20)
CO2: 27 mmol/L (ref 22–32)
Calcium: 9 mg/dL (ref 8.9–10.3)
Chloride: 102 mmol/L (ref 98–111)
Creatinine, Ser: 0.66 mg/dL (ref 0.61–1.24)
GFR, Estimated: >60 mL/min (ref >60)
Glucose, Bld: 98 mg/dL (ref 70–99)
Potassium: 3.9 mmol/L (ref 3.5–5.1)
Sodium: 135 mmol/L (ref 135–145)
Total Bilirubin: 0.6 mg/dL (ref 0.3–1.2)
Total Protein: 7.5 g/dL (ref 6.5–8.1)

## 2021-06-22 LAB — URINALYSIS, COMPLETE (UACMP) WITH MICROSCOPIC
Bacteria, UA: NONE SEEN
Bilirubin Urine: NEGATIVE
Glucose, UA: NEGATIVE mg/dL
Hgb urine dipstick: NEGATIVE
Ketones, ur: NEGATIVE mg/dL
Leukocytes,Ua: NEGATIVE
Nitrite: NEGATIVE
Protein, ur: NEGATIVE mg/dL
Specific Gravity, Urine: 1.01 (ref 1.005–1.030)
Squamous Epithelial / HPF: NONE SEEN (ref 0–5)
pH: 5.5 (ref 5.0–8.0)

## 2021-06-22 LAB — CBC WITH DIFFERENTIAL/PLATELET
Abs Immature Granulocytes: 0.03 10*3/uL (ref 0.00–0.07)
Basophils Absolute: 0 10*3/uL (ref 0.0–0.1)
Basophils Relative: 0 %
Eosinophils Absolute: 0.1 10*3/uL (ref 0.0–0.5)
Eosinophils Relative: 2 %
HCT: 36.8 % — ABNORMAL LOW (ref 39.0–52.0)
Hemoglobin: 13.1 g/dL (ref 13.0–17.0)
Immature Granulocytes: 0 %
Lymphocytes Relative: 14 %
Lymphs Abs: 1.1 10*3/uL (ref 0.7–4.0)
MCH: 31.6 pg (ref 26.0–34.0)
MCHC: 35.6 g/dL (ref 30.0–36.0)
MCV: 88.9 fL (ref 80.0–100.0)
Monocytes Absolute: 1 10*3/uL (ref 0.1–1.0)
Monocytes Relative: 12 %
Neutro Abs: 5.5 10*3/uL (ref 1.7–7.7)
Neutrophils Relative %: 72 %
Platelets: 228 10*3/uL (ref 150–400)
RBC: 4.14 MIL/uL — ABNORMAL LOW (ref 4.22–5.81)
RDW: 12.2 % (ref 11.5–15.5)
WBC: 7.7 10*3/uL (ref 4.0–10.5)
nRBC: 0 % (ref 0.0–0.2)

## 2021-06-22 NOTE — ED Notes (Signed)
Pt states 'I am going to go to my car real quick and ill be right back'

## 2021-06-22 NOTE — ED Provider Notes (Addendum)
[facility]-EMERGENCY DEPARTMENT  ____________________________________________  Time seen: Approximately 10:40 PM  I have reviewed the triage vital signs and the nursing notes.   HISTORY  Chief Complaint Fever   Historian Patient     HPI Patrick Belarde. is a 50 y.o. male presents to the emergency department with with 2 days of fever at home.  Patient reports that he went to his mother-in-law's house yesterday and when he pulled into the driveway, was drenched in sweat.  Patient reports that his wife took his temperature at home and it was 13 F assessed orally.  Patient has had no rhinorrhea, nasal congestion or nonproductive cough.  He denies vomiting, diarrhea or rash.  He has had no abdominal pain.  No recent travel.  No sick contacts in the home.  Patient states that he has had some increased urinary frequency but denies dysuria or hematuria.   Past Medical History:  Diagnosis Date   Degenerative disc disease, cervical    PTSD (post-traumatic stress disorder)      Immunizations up to date:  Yes.     Past Medical History:  Diagnosis Date   Degenerative disc disease, cervical    PTSD (post-traumatic stress disorder)     There are no problems to display for this patient.   Past Surgical History:  Procedure Laterality Date   HERNIA REPAIR      Prior to Admission medications   Medication Sig Start Date End Date Taking? Authorizing Provider  amoxicillin (AMOXIL) 875 MG tablet Take 1 tablet (875 mg total) by mouth 2 (two) times daily. 12/04/20   Johnn Hai, PA-C    Allergies Shellfish allergy  History reviewed. No pertinent family history.  Social History Social History   Tobacco Use   Smoking status: Every Day    Packs/day: 2.00    Years: 25.00    Pack years: 50.00    Types: Cigarettes   Smokeless tobacco: Never  Substance Use Topics   Alcohol use: No   Drug use: No     Review of Systems  Constitutional: Patient has fever.  Eyes:  No  discharge ENT: No upper respiratory complaints. Respiratory: no cough. No SOB/ use of accessory muscles to breath Gastrointestinal:   No nausea, no vomiting.  No diarrhea.  No constipation. Musculoskeletal: Negative for musculoskeletal pain. Skin: Negative for rash, abrasions, lacerations, ecchymosis.    ____________________________________________   PHYSICAL EXAM:  VITAL SIGNS: ED Triage Vitals  Enc Vitals Group     BP 06/22/21 2140 (!) 144/87     Pulse Rate 06/22/21 2140 86     Resp 06/22/21 2140 18     Temp 06/22/21 2140 98.5 F (36.9 C)     Temp src --      SpO2 06/22/21 2140 96 %     Weight 06/22/21 2141 195 lb (88.5 kg)     Height 06/22/21 2141 '5\' 10"'$  (1.778 m)     Head Circumference --      Peak Flow --      Pain Score 06/22/21 2141 5     Pain Loc --      Pain Edu? --      Excl. in [place]? --     Constitutional: Alert and oriented. Patient is lying supine. Eyes: Conjunctivae are normal. PERRL. EOMI. Head: Atraumatic. ENT:      Ears: Tympanic membranes are mildly injected with mild effusion bilaterally.       Nose: No congestion/rhinnorhea.      Mouth/Throat: Mucous  membranes are moist. Posterior pharynx is mildly erythematous.  Hematological/Lymphatic/Immunilogical: No cervical lymphadenopathy.  Cardiovascular: Normal rate, regular rhythm. Normal S1 and S2.  Good peripheral circulation. Respiratory: Normal respiratory effort without tachypnea or retractions. Lungs CTAB. Good air entry to the bases with no decreased or absent breath sounds. Gastrointestinal: Bowel sounds 4 quadrants. Soft and nontender to palpation. No guarding or rigidity. No palpable masses. No distention. No CVA tenderness. Musculoskeletal: Full range of motion to all extremities. No gross deformities appreciated. Neurologic:  Normal speech and language. No gross focal neurologic deficits are appreciated.  Skin:  Skin is warm, dry and intact. No rash noted. Psychiatric: Mood and affect are  normal. Speech and behavior are normal. Patient exhibits appropriate insight and judgement.    ____________________________________________   LABS (all labs ordered are listed, but only abnormal results are displayed)  Labs Reviewed  CBC WITH DIFFERENTIAL/PLATELET - Abnormal; Notable for the following components:      Result Value   RBC 4.14 (*)    HCT 36.8 (*)    All other components within normal limits  RESP PANEL BY RT-PCR (FLU A&B, COVID) ARPGX2  COMPREHENSIVE METABOLIC PANEL  URINALYSIS, COMPLETE (UACMP) WITH MICROSCOPIC   ____________________________________________  EKG   ____________________________________________  RADIOLOGY   No results found.  ____________________________________________    PROCEDURES  Procedure(s) performed:     Procedures     Medications - No data to display   ____________________________________________   INITIAL IMPRESSION / ASSESSMENT AND PLAN / ED COURSE  Pertinent labs & imaging results that were available during my care of the patient were reviewed by me and considered in my medical decision making (see chart for details).       Assessment and Plan: Fever:  50 year old male presents to the emergency department with fever for the past 2 days.  Vital signs are reassuring at triage.  On physical exam, patient was alert, active and nontoxic-appearing with no increased work of breathing.  CBC and CMP were reassuring.  COVID-19 and influenza testing were negative.  Suspect unspecified viral infection at this time.  Rest and hydration were encouraged at home.  Tylenol and ibuprofen alternating were recommended for fever if fever occurs.  Return precautions were given to return with new or worsening symptoms.  All patient questions were answered.  ____________________________________________  FINAL CLINICAL IMPRESSION(S) / ED DIAGNOSES  Final diagnoses:  Fever, unspecified fever cause      NEW MEDICATIONS STARTED  DURING THIS VISIT:  ED Discharge Orders     None           This chart was dictated using voice recognition software/Dragon. Despite best efforts to proofread, errors can occur which can change the meaning. Any change was purely unintentional.     Lannie Fields, PA-C 06/22/21 2328    Vallarie Mare [place], PA-C 06/22/21 2334    Merlyn Lot, MD 06/23/21 1054

## 2021-06-22 NOTE — ED Triage Notes (Signed)
Pt presents to ED via POV, c/o fever and headache that started yesterday. Highest fever at home was 102, temp in triage is 98.5. Has not taken any tylenol or ibuprofen today but did yesterday. Denies other symptoms, denies close contact to any sick people.

## 2021-08-09 ENCOUNTER — Encounter: Payer: Self-pay | Admitting: Emergency Medicine

## 2021-08-09 ENCOUNTER — Other Ambulatory Visit: Payer: Self-pay

## 2021-08-09 ENCOUNTER — Emergency Department
Admission: EM | Admit: 2021-08-09 | Discharge: 2021-08-09 | Disposition: A | Discharge status: Home / Self Care | Payer: Medicaid Other | Attending: Emergency Medicine | Admitting: Emergency Medicine

## 2021-08-09 ENCOUNTER — Emergency Department: Payer: Medicaid Other

## 2021-08-09 DIAGNOSIS — S4991XA Unspecified injury of right shoulder and upper arm, initial encounter: Secondary | ICD-10-CM | POA: Insufficient documentation

## 2021-08-09 DIAGNOSIS — F17210 Nicotine dependence, cigarettes, uncomplicated: Secondary | ICD-10-CM | POA: Insufficient documentation

## 2021-08-09 DIAGNOSIS — W1789XA Other fall from one level to another, initial encounter: Secondary | ICD-10-CM | POA: Insufficient documentation

## 2021-08-09 DIAGNOSIS — S4990XA Unspecified injury of shoulder and upper arm, unspecified arm, initial encounter: Secondary | ICD-10-CM

## 2021-08-09 MED ORDER — NAPROXEN 500 MG PO TABS: 500.0000 mg | ORAL_TABLET | Freq: Two times a day (BID) | ORAL | 0 refills | Status: DC

## 2021-08-09 MED ORDER — TRAMADOL HCL 50 MG PO TABS: 50.0000 mg | ORAL_TABLET | Freq: Once | ORAL | Status: DC

## 2021-08-09 MED ORDER — TRAMADOL HCL 50 MG PO TABS: 50.0000 mg | ORAL_TABLET | Freq: Four times a day (QID) | ORAL | 0 refills | Status: DC | PRN

## 2021-08-09 MED ORDER — NAPROXEN 500 MG PO TABS: 500.0000 mg | ORAL_TABLET | Freq: Once | ORAL | Status: DC

## 2021-08-09 NOTE — ED Notes (Signed)
Pt states tonight they slipped off a trailer and attempted to catch themself with their R arm and caused shoulder pain. Pt denies any other concerns at this time

## 2021-08-09 NOTE — ED Triage Notes (Signed)
Pt arrived via POV with reports falling off a trailer, states he was trying to catch himself and states his R arm bent back, c/o R upper arm pain.  Pt denies any head injury or neck injury.

## 2021-08-09 NOTE — ED Provider Notes (Signed)
Emergency Medicine Provider Triage Evaluation Note  Patrick Hammond. , a 50 y.o. male  was evaluated in triage.  Pt complains of right upper arm pain after patient fell from a trailer.  Denies hitting his head or his neck.  No numbness or tingling in the right arm.  No chest pain, chest tightness or abdominal pain.  No abrasions or lacerations.  Review of Systems  Positive: Patient has right arm pain.  Negative: No numbness or tingling  Physical Exam  Ht 5\' 10"  (1.778 m)   Wt 88.5 kg   BMI 27.98 kg/m  Gen:   Awake, no distress   Resp:  Normal effort  MSK:   Patient has pain with range of motion of the right shoulder. Other:    Medical Decision Making  Medically screening exam initiated at 7:03 PM.  Appropriate orders placed.  Patrick Hammond. was informed that the remainder of the evaluation will be completed by another provider, this initial triage assessment does not replace that evaluation, and the importance of remaining in the ED until their evaluation is complete.     Patrick Hammond, Patrick Hammond 08/09/21 1904    Patrick Mew, MD 08/10/21 0020

## 2021-08-09 NOTE — ED Provider Notes (Signed)
[facility] Emergency Department Provider Note ____________________________________________  Time seen: Approximately 7:23 PM  I have reviewed the triage vital signs and the nursing notes.   HISTORY  Chief Complaint Arm Pain    HPI Patrick Hammond. is a 50 y.o. male who presents to the emergency department for evaluation and treatment of right upper arm pain after falling off a trailer. He tried to catch himself and his arm bent backward. No loss of consciousness. No neck or back pain.   Past Medical History:  Diagnosis Date   Degenerative disc disease, cervical    PTSD (post-traumatic stress disorder)     There are no problems to display for this patient.   Past Surgical History:  Procedure Laterality Date   HERNIA REPAIR      Prior to Admission medications   Medication Sig Start Date End Date Taking? Authorizing Provider  naproxen (NAPROSYN) 500 MG tablet Take 1 tablet (500 mg total) by mouth 2 (two) times daily with a meal. 08/09/21  Yes Emilyann Banka B, FNP  traMADol (ULTRAM) 50 MG tablet Take 1 tablet (50 mg total) by mouth every 6 (six) hours as needed. 08/09/21  Yes Lalonnie Shaffer B, FNP  amoxicillin (AMOXIL) 875 MG tablet Take 1 tablet (875 mg total) by mouth 2 (two) times daily. 12/04/20   Johnn Hai, PA-C    Allergies Shellfish allergy  History reviewed. No pertinent family history.  Social History Social History   Tobacco Use   Smoking status: Every Day    Packs/day: 2.00    Years: 25.00    Pack years: 50.00    Types: Cigarettes   Smokeless tobacco: Never  Substance Use Topics   Alcohol use: No   Drug use: No    Review of Systems Constitutional: Negative for fever. Cardiovascular: Negative for chest pain. Respiratory: Negative for shortness of breath. Musculoskeletal: Positive for right arm pain Skin: Negative for open wounds.  Neurological: Negative for decrease in  sensation  ____________________________________________   PHYSICAL EXAM:  VITAL SIGNS: ED Triage Vitals  Enc Vitals Group     BP 08/09/21 1903 (!) 143/100     Pulse Rate 08/09/21 1903 78     Resp 08/09/21 1903 18     Temp 08/09/21 1903 98.1 F (36.7 C)     Temp Source 08/09/21 1903 Oral     SpO2 08/09/21 1903 95 %     Weight 08/09/21 1903 195 lb (88.5 kg)     Height 08/09/21 1903 5\' 10"  (1.778 m)     Head Circumference --      Peak Flow --      Pain Score 08/09/21 1902 7     Pain Loc --      Pain Edu? --      Excl. in [place]? --     Constitutional: Alert and oriented. Well appearing and in no acute distress. Eyes: Conjunctivae are clear without discharge or drainage Head: Atraumatic Neck: Supple. No midline tenderness Respiratory: No cough. Respirations are even and unlabored. Musculoskeletal: Pain right upper arm. No pain of elbow with palpation or extension. Upper arm pain with attempt to abduct. Neurologic: Motor and sensory function of right hand intact.  Skin: No open wounds over right upper extremity.  Psychiatric: Affect and behavior are appropriate.  ____________________________________________   LABS (all labs ordered are listed, but only abnormal results are displayed)  Labs Reviewed - No data to display ____________________________________________  RADIOLOGY  Questionable nondisplaced oblique fracture mid  humerus. Radiology reading negative for acute bony abnormality.  I, Sherrie George, personally viewed and evaluated these images (plain radiographs) as part of my medical decision making, as well as reviewing the written report by the radiologist.  DG Humerus Right  Result Date: 08/09/2021 CLINICAL DATA:  fall EXAM: RIGHT HUMERUS - 2+ VIEW COMPARISON:  None. FINDINGS: No acute fracture or dislocation. Joint spaces and alignment are maintained. Enthesopathic changes of the distal humerus. No area of erosion or osseous destruction. No unexpected radiopaque  foreign body. Soft tissues are unremarkable. IMPRESSION: No acute fracture or dislocation. Electronically Signed   By: Valentino Saxon M.D.   On: 08/09/2021 19:30   ____________________________________________   PROCEDURES  Procedures  ____________________________________________   INITIAL IMPRESSION / ASSESSMENT AND PLAN / ED COURSE  Patrick Hammond. is a 50 y.o. who presents to the emergency department for treatment and evaluation of right upper arm pain after mechanical, nonsyncopal fall from about 3 to 4 feet earlier this afternoon.  See HPI for further details.  Patient has no focal tenderness in the elbow.  He does have focal tenderness in the mid to upper humerus.  There is no obvious deformity.  X-ray read by radiology as negative for acute findings.  My read is concerning for questionable nondisplaced humerus fracture.  Plan will be to place him in a sling until he can follow-up with orthopedics.  Patient aware of findings and agrees to follow up.  Patient instructed to follow-up with orthopedics next week.  He was also instructed to return to the emergency department for symptoms that change or worsen if unable schedule an appointment with orthopedics or primary care.  Medications  traMADol (ULTRAM) tablet 50 mg (has no administration in time range)  naproxen (NAPROSYN) tablet 500 mg (has no administration in time range)    Pertinent labs & imaging results that were available during my care of the patient were reviewed by me and considered in my medical decision making (see chart for details).   _________________________________________   FINAL CLINICAL IMPRESSION(S) / ED DIAGNOSES  Final diagnoses:  Injury of humerus, initial encounter    ED Discharge Orders          Ordered    naproxen (NAPROSYN) 500 MG tablet  2 times daily with meals        08/09/21 1949    traMADol (ULTRAM) 50 MG tablet  Every 6 hours PRN        08/09/21 1949             If  controlled substance prescribed during this visit, 12 month history viewed on the [facility] prior to issuing an initial prescription for Schedule II or III opiod.    Victorino Dike, FNP 08/09/21 1953    Naaman Plummer, MD 08/10/21 1235

## 2021-08-09 NOTE — ED Notes (Signed)
Sling provided to patient. Teaching complete. Pt given RX information and followup care info. PT assisted off unit on foot.

## 2021-08-09 NOTE — Discharge Instructions (Signed)
You may remove the sling to shower, but otherwise keep it in place until follow up with orthopedics.  Call Monday for an appointment.

## 2022-04-09 ENCOUNTER — Emergency Department
Admission: EM | Admit: 2022-04-09 | Discharge: 2022-04-09 | Disposition: A | Discharge status: Home / Self Care | Payer: Medicaid Other | Attending: Emergency Medicine | Admitting: Emergency Medicine

## 2022-04-09 ENCOUNTER — Encounter: Payer: Self-pay | Admitting: Intensive Care

## 2022-04-09 ENCOUNTER — Other Ambulatory Visit: Payer: Self-pay

## 2022-04-09 DIAGNOSIS — K029 Dental caries, unspecified: Secondary | ICD-10-CM | POA: Insufficient documentation

## 2022-04-09 MED ORDER — KETOROLAC TROMETHAMINE 15 MG/ML IJ SOLN: 15.0000 mg | Freq: Once | INTRAMUSCULAR | Status: DC

## 2022-04-09 MED ORDER — AMOXICILLIN-POT CLAVULANATE 875-125 MG PO TABS: 1.0000 | ORAL_TABLET | Freq: Two times a day (BID) | ORAL | 0 refills | Status: AC

## 2022-04-09 MED ORDER — KETOROLAC TROMETHAMINE 15 MG/ML IJ SOLN
15.0000 mg | Freq: Once | INTRAMUSCULAR | Status: AC
  Administered 2022-04-09: 15 mg via INTRAMUSCULAR
  Filled 2022-04-09: qty 1

## 2022-04-09 MED ORDER — CHLORHEXIDINE GLUCONATE 0.12 % MT SOLN: 15.0000 mL | Freq: Two times a day (BID) | OROMUCOSAL | 0 refills | Status: AC

## 2022-04-09 NOTE — ED Triage Notes (Signed)
Patient c/o "abscess tooth" that started hurting yesterday afternoon

## 2022-04-09 NOTE — Discharge Instructions (Signed)
Please follow-up with a dentist as soon as possible.  Please take the antibiotics as prescribed.  Please return for any new, worsening, or change in symptoms or other concerns.

## 2022-04-09 NOTE — ED Provider Notes (Cosign Needed)
[facility] Provider Note    Event Date/Time   First MD Initiated Contact with Patient 04/09/22 1426     (approximate)   History   Dental Pain   HPI  Patrick Hammond. is a 51 y.o. male who presents today for evaluation of dental pain.  Patient reports that he has a decaying tooth that has gotten more painful since last night.  He denies any injuries.  No fevers or chills.  No facial or neck swelling.  No drooling.  No trouble breathing or swallowing.  No swelling underneath his tongue.  There are no problems to display for this patient.         Physical Exam   Triage Vital Signs: ED Triage Vitals [04/09/22 1345]  Enc Vitals Group     BP 139/77     Pulse Rate 72     Resp 16     Temp 98.1 F (36.7 C)     Temp Source Oral     SpO2 97 %     Weight 185 lb (83.9 kg)     Height '5\' 10"'$  (1.778 m)     Head Circumference      Peak Flow      Pain Score 10     Pain Loc      Pain Edu?      Excl. in [place]?     Most recent vital signs: Vitals:   04/09/22 1345  BP: 139/77  Pulse: 72  Resp: 16  Temp: 98.1 F (36.7 C)  SpO2: 97%    Physical Exam Vitals and nursing note reviewed.  Constitutional:      General: Awake and alert. No acute distress.    Appearance: Normal appearance. The patient is normal weight.  HENT:     Head: Normocephalic and atraumatic.     Mouth: Mucous membranes are moist. Obvious dental decay and caries noted throughout mouth. Tap tenderness to left lower molars. No focal area of gingival fluctuance no gingival edema noted throughout.  No facial or neck erythema or cellulitis.  No sublingual swelling, no drooling.  No trismus Eyes:     General: PERRL. Normal EOMs        Right eye: No discharge.        Left eye: No discharge.     Conjunctiva/sclera: Conjunctivae normal.  Cardiovascular:     Rate and Rhythm: Normal rate and regular rhythm.     Pulses: Normal pulses.  Pulmonary:     Effort: Pulmonary effort is normal. No  respiratory distress.  Abdominal:     Abdomen is soft. There is no abdominal tenderness. Musculoskeletal:        General: No swelling. Normal range of motion.     Cervical back: Normal range of motion and neck supple.  Skin:    General: Skin is warm and dry.     Capillary Refill: Capillary refill takes less than 2 seconds.     Findings: No rash.  Neurological:     Mental Status: The patient is awake and alert.      ED Results / Procedures / Treatments   Labs (all labs ordered are listed, but only abnormal results are displayed) Labs Reviewed - No data to display   EKG     RADIOLOGY     PROCEDURES:  Critical Care performed:   Procedures   MEDICATIONS ORDERED IN ED: Medications  ketorolac (TORADOL) 15 MG/ML injection 15 mg (15 mg Intramuscular Given 04/09/22 1453)  IMPRESSION / MDM / ASSESSMENT AND PLAN / ED COURSE  I reviewed the triage vital signs and the nursing notes.   Differential diagnosis includes, but is not limited to, dental caries, pulpitis, abscess, decay.  Patient has obvious dental decay.  No gingival swelling or fluctuance concerning for gingival abscess.  No trismus, nuchal rigidity, neck pain, hot potato voice, uvular deviation or malocclusion to suggest deep space infection. No sublingual swelling concerning for Ludwig's angina.  Patient declined needle aspiration or dental block.  Patient was started on antibiotics and chlorhexidine mouth rinse.  Patient was treated symptomatically in the emergency department. Discussed care plan, return precautions, and advised close outpatient follow-up with dentist. Patient agrees with plan of care.    Patient's presentation is most consistent with acute, uncomplicated illness.     FINAL CLINICAL IMPRESSION(S) / ED DIAGNOSES   Final diagnoses:  Pain due to dental caries  Infected dental caries     Rx / DC Orders   ED Discharge Orders          Ordered    chlorhexidine (PERIDEX) 0.12 %  solution  2 times daily        04/09/22 1441    amoxicillin-clavulanate (AUGMENTIN) 875-125 MG tablet  2 times daily        04/09/22 1441             Note:  This document was prepared using Dragon voice recognition software and may include unintentional dictation errors.   Marquette Old, PA-C 04/09/22 1546

## 2022-08-15 IMAGING — CR DG HUMERUS 2V *R*
2 series · 2 of 2 positions shown · non-contrast
Comparison: None.

CLINICAL DATA: fall

EXAM:
RIGHT HUMERUS - 2+ VIEW

[humerus ap]
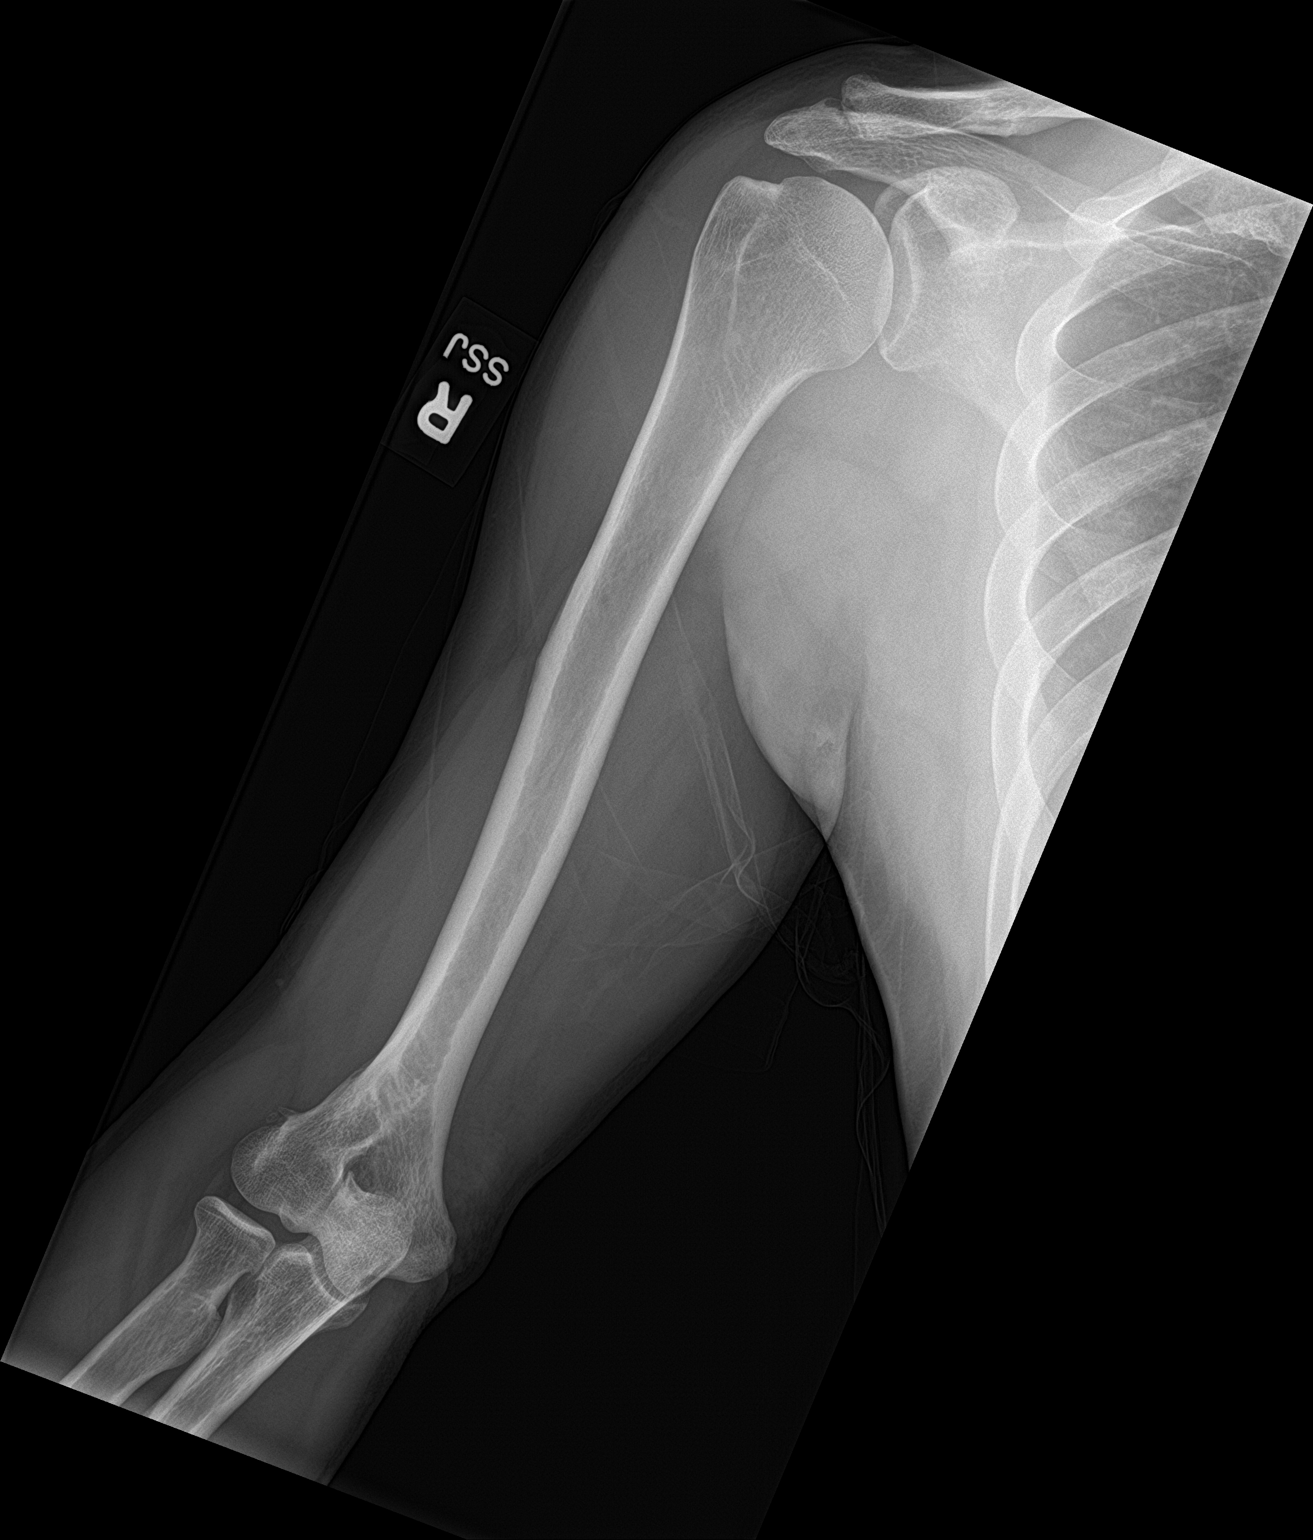

[humerus lat]
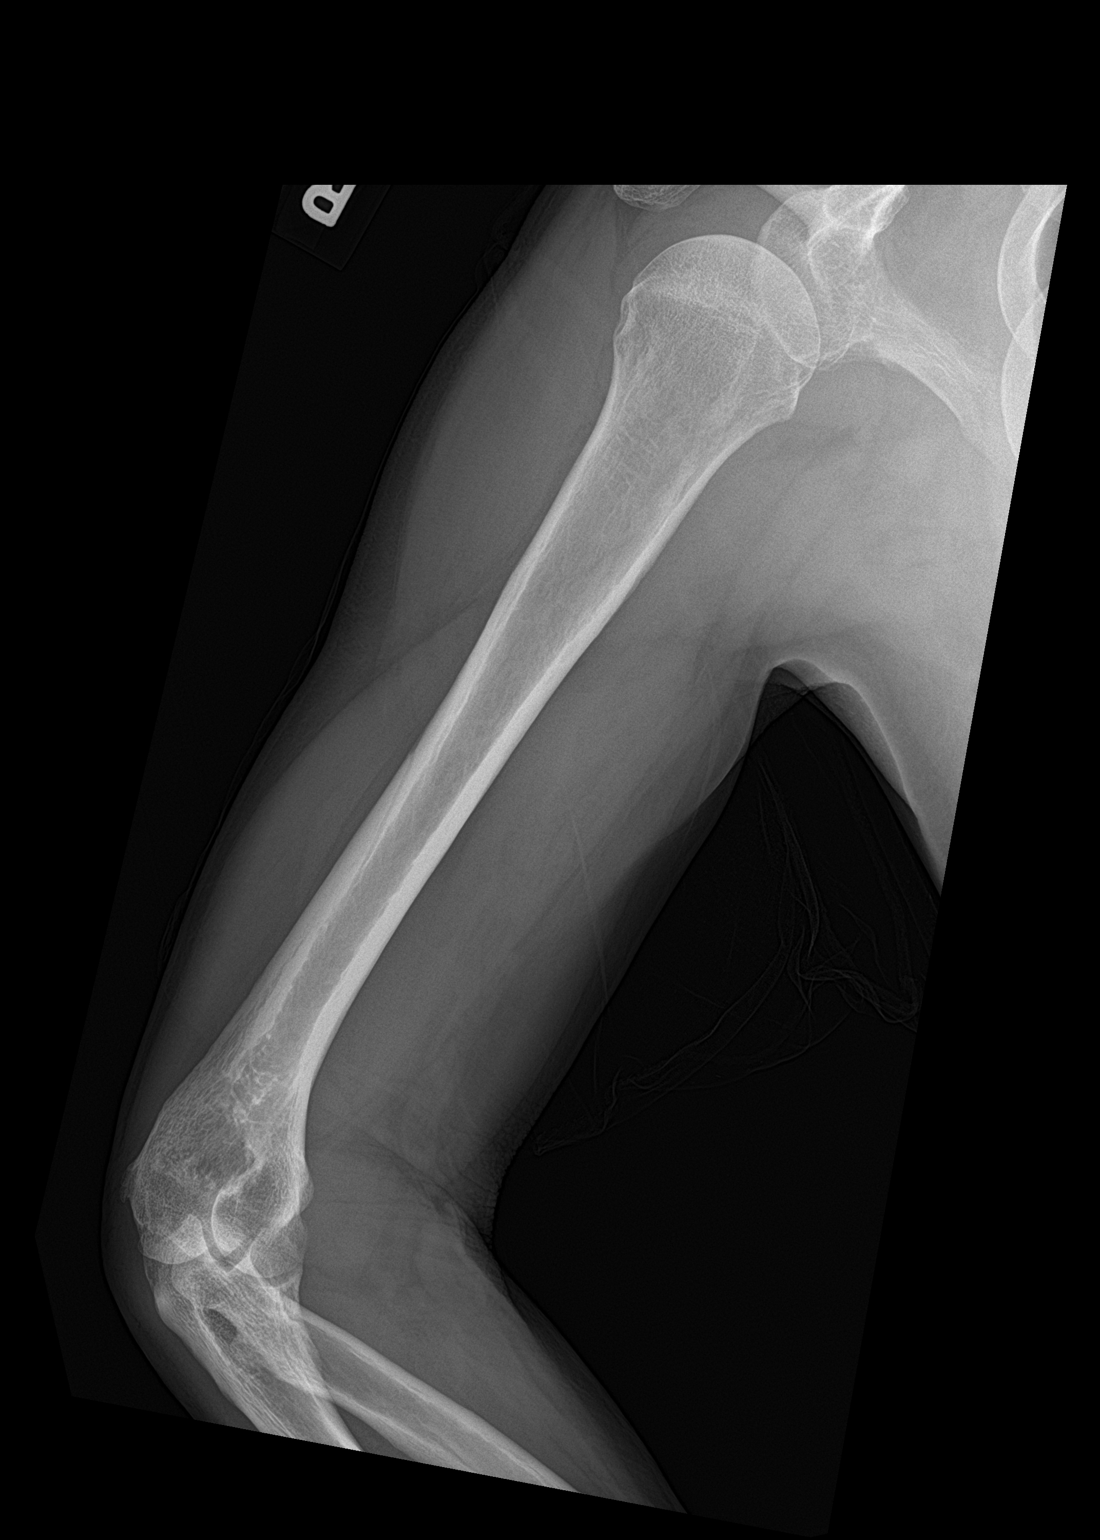

[2 of 2 positions shown; findings below may reference images not displayed]

FINDINGS: No acute fracture or dislocation. Joint spaces and alignment are
maintained. Enthesopathic changes of the distal humerus. No area of
erosion or osseous destruction. No unexpected radiopaque foreign
body. Soft tissues are unremarkable.
IMPRESSION: No acute fracture or dislocation.
